# Patient Record
Sex: Female | Born: 1935 | Race: White | Hispanic: No | Smoking: Never smoker
Health system: Southern US, Community
[De-identification: ages and names within clinical notes are randomized; demographics above are authoritative.]

## PROBLEM LIST (undated history)

## (undated) HISTORY — PX: JOINT REPLACEMENT: SHX530

---

## 1997-09-14 ENCOUNTER — Emergency Department (HOSPITAL_COMMUNITY): Admission: RE | Admit: 1997-09-14 | Discharge: 1997-09-14 | Payer: Self-pay | Admitting: Emergency Medicine

## 1998-01-13 ENCOUNTER — Ambulatory Visit (HOSPITAL_COMMUNITY): Admission: RE | Admit: 1998-01-13 | Discharge: 1998-01-13 | Payer: Self-pay | Admitting: Internal Medicine

## 1998-03-25 ENCOUNTER — Other Ambulatory Visit: Admission: RE | Admit: 1998-03-25 | Discharge: 1998-03-25 | Payer: Self-pay | Admitting: Obstetrics and Gynecology

## 1998-08-17 ENCOUNTER — Other Ambulatory Visit: Admission: RE | Admit: 1998-08-17 | Discharge: 1998-08-17 | Payer: Self-pay | Admitting: Urology

## 1999-04-08 ENCOUNTER — Other Ambulatory Visit: Admission: RE | Admit: 1999-04-08 | Discharge: 1999-04-08 | Payer: Self-pay | Admitting: Obstetrics and Gynecology

## 1999-08-20 ENCOUNTER — Other Ambulatory Visit: Admission: RE | Admit: 1999-08-20 | Discharge: 1999-08-20 | Payer: Self-pay | Admitting: Urology

## 2000-05-01 ENCOUNTER — Ambulatory Visit (HOSPITAL_COMMUNITY): Admission: RE | Admit: 2000-05-01 | Discharge: 2000-05-01 | Payer: Self-pay | Admitting: Internal Medicine

## 2000-05-11 ENCOUNTER — Other Ambulatory Visit: Admission: RE | Admit: 2000-05-11 | Discharge: 2000-05-11 | Payer: Self-pay | Admitting: Obstetrics and Gynecology

## 2001-02-08 ENCOUNTER — Encounter (INDEPENDENT_AMBULATORY_CARE_PROVIDER_SITE_OTHER): Payer: Self-pay | Admitting: Specialist

## 2001-02-08 ENCOUNTER — Ambulatory Visit (HOSPITAL_BASED_OUTPATIENT_CLINIC_OR_DEPARTMENT_OTHER): Admission: RE | Admit: 2001-02-08 | Discharge: 2001-02-08 | Payer: Self-pay | Admitting: Plastic Surgery

## 2001-07-15 ENCOUNTER — Emergency Department (HOSPITAL_COMMUNITY): Admission: EM | Admit: 2001-07-15 | Discharge: 2001-07-15 | Payer: Self-pay | Admitting: Emergency Medicine

## 2001-07-15 ENCOUNTER — Encounter: Payer: Self-pay | Admitting: Emergency Medicine

## 2001-07-17 ENCOUNTER — Ambulatory Visit (HOSPITAL_COMMUNITY): Admission: RE | Admit: 2001-07-17 | Discharge: 2001-07-17 | Payer: Self-pay | Admitting: Internal Medicine

## 2001-07-24 ENCOUNTER — Ambulatory Visit (HOSPITAL_COMMUNITY): Admission: RE | Admit: 2001-07-24 | Discharge: 2001-07-24 | Payer: Self-pay | Admitting: Gastroenterology

## 2001-07-24 ENCOUNTER — Encounter (INDEPENDENT_AMBULATORY_CARE_PROVIDER_SITE_OTHER): Payer: Self-pay | Admitting: Specialist

## 2001-08-07 ENCOUNTER — Other Ambulatory Visit: Admission: RE | Admit: 2001-08-07 | Discharge: 2001-08-07 | Payer: Self-pay | Admitting: Urology

## 2002-03-18 ENCOUNTER — Other Ambulatory Visit: Admission: RE | Admit: 2002-03-18 | Discharge: 2002-03-18 | Payer: Self-pay | Admitting: Obstetrics and Gynecology

## 2002-07-13 ENCOUNTER — Inpatient Hospital Stay (HOSPITAL_COMMUNITY): Admission: EM | Admit: 2002-07-13 | Discharge: 2002-07-17 | Payer: Self-pay | Admitting: Emergency Medicine

## 2002-07-13 ENCOUNTER — Encounter: Payer: Self-pay | Admitting: Emergency Medicine

## 2002-07-17 ENCOUNTER — Inpatient Hospital Stay (HOSPITAL_COMMUNITY)
Admission: RE | Admit: 2002-07-17 | Discharge: 2002-07-22 | Payer: Self-pay | Admitting: Physical Medicine & Rehabilitation

## 2003-03-19 ENCOUNTER — Other Ambulatory Visit: Admission: RE | Admit: 2003-03-19 | Discharge: 2003-03-19 | Payer: Self-pay | Admitting: Obstetrics and Gynecology

## 2004-02-02 ENCOUNTER — Encounter: Admission: RE | Admit: 2004-02-02 | Discharge: 2004-02-02 | Payer: Self-pay | Admitting: Internal Medicine

## 2004-02-26 ENCOUNTER — Encounter: Admission: RE | Admit: 2004-02-26 | Discharge: 2004-02-26 | Payer: Self-pay | Admitting: Internal Medicine

## 2004-07-19 ENCOUNTER — Ambulatory Visit (HOSPITAL_COMMUNITY): Admission: RE | Admit: 2004-07-19 | Discharge: 2004-07-19 | Payer: Self-pay | Admitting: Obstetrics and Gynecology

## 2004-09-14 ENCOUNTER — Ambulatory Visit (HOSPITAL_COMMUNITY): Admission: RE | Admit: 2004-09-14 | Discharge: 2004-09-14 | Payer: Self-pay | Admitting: Obstetrics and Gynecology

## 2005-01-05 ENCOUNTER — Ambulatory Visit (HOSPITAL_COMMUNITY): Admission: RE | Admit: 2005-01-05 | Discharge: 2005-01-05 | Payer: Self-pay | Admitting: Obstetrics and Gynecology

## 2005-02-03 ENCOUNTER — Emergency Department (HOSPITAL_COMMUNITY): Admission: EM | Admit: 2005-02-03 | Discharge: 2005-02-03 | Payer: Self-pay | Admitting: Emergency Medicine

## 2005-05-19 ENCOUNTER — Other Ambulatory Visit: Admission: RE | Admit: 2005-05-19 | Discharge: 2005-05-19 | Payer: Self-pay | Admitting: Obstetrics and Gynecology

## 2007-06-06 ENCOUNTER — Ambulatory Visit (HOSPITAL_COMMUNITY): Admission: RE | Admit: 2007-06-06 | Discharge: 2007-06-06 | Payer: Self-pay | Admitting: Internal Medicine

## 2007-06-06 ENCOUNTER — Ambulatory Visit: Payer: Self-pay | Admitting: Vascular Surgery

## 2007-06-06 ENCOUNTER — Encounter (INDEPENDENT_AMBULATORY_CARE_PROVIDER_SITE_OTHER): Payer: Self-pay | Admitting: Internal Medicine

## 2007-07-23 ENCOUNTER — Other Ambulatory Visit: Admission: RE | Admit: 2007-07-23 | Discharge: 2007-07-23 | Payer: Self-pay | Admitting: Obstetrics and Gynecology

## 2008-09-02 ENCOUNTER — Ambulatory Visit (HOSPITAL_COMMUNITY): Admission: RE | Admit: 2008-09-02 | Discharge: 2008-09-02 | Payer: Self-pay | Admitting: Orthopaedic Surgery

## 2009-08-05 ENCOUNTER — Encounter: Admission: RE | Admit: 2009-08-05 | Discharge: 2009-08-05 | Payer: Self-pay | Admitting: Obstetrics and Gynecology

## 2011-01-07 NOTE — Discharge Summary (Signed)
NAME:  Morgan Turner, Morgan Turner                         ACCOUNT NO.:  [id number]   MEDICAL RECORD NO.:  [id number]                   PATIENT TYPE:  INP   LOCATION:  0467                                 FACILITY:  [facility]   PHYSICIAN:  Mark C. [name], M.D.                 DATE OF BIRTH:  07/15/1917   DATE OF ADMISSION:  07/13/2002  DATE OF DISCHARGE:  07/17/2002                                 DISCHARGE SUMMARY   FINAL DIAGNOSIS:  Status post left cemented monopolar hip hemiarthroplasty  for femoral neck fracture.   HISTORY OF PRESENT ILLNESS:  The patient is an 77 year old white female who  presented to the [facility] Emergency Room after falling in wet  grass at her home.  She was unable to ambulate after this accident.  X-rays  in the emergency room showed femoral neck fracture with shortening.  Denied  any lower extremity numbness or tingling.   LABORATORY DATA:  White blood cell count 12.1, red blood cell count 4.2,  hemoglobin 12.8, hematocrit 38, platelets 197.  PT 13.3, INR 1, PTT 30.  Sodium 139, potassium 4.5, chloride 107, CO2 28, glucose 128, BUN 20,  creatinine 0.8, calcium 9.2.  EKG showed normal sinus rhythm with sinus  arrhythmia.   HOSPITAL COURSE:  After evaluation in the emergency room, the patient was  taken to the operating room, and a left cemented monopolar hip  hemiarthroplasty procedure was performed.  Surgeon was Annell Greening, M.D.  Anesthesia was general.  Estimated blood loss was 100 cc.  The patient was  transferred to recovery room and then the orthopaedic floor in stable  condition.  That evening, she was started on pharmacy protocol for Coumadin  for deep venous thrombosis prophylaxis.  On 07/14/02, hemoglobin was 11, INR  1.1.  X-rays showed satisfactory position of the prosthesis.  Evaluated by  care management.  On 07/15/02, PT 17.9, INR 1.6, hemoglobin 9.9, vital signs  stable, temperature 100.7.  Staples intact.  Dressing change ordered.  The  patient was evaluated by physical therapy.  On 07/16/02, no specific  complaints.  No bowel movement.  Dulcolax suppository was ordered.  FESO4 at  325 mg p.o. daily ordered.  Bed-to-chair transfers.  Rehabilitation consult  ordered.  On 07/17/02, the patient continued to improve.  Vital signs stable  and afebrile.  PT 19.4, INR 1.8.  No complaints.  Rehabilitation bed became  available, and the patient was doing well for transfer.   DISPOSITION:  Discharged to rehabilitation.   CONDITION ON DISCHARGE:  Good and stable.   DISCHARGE MEDICATIONS:  Continue all current medications.   DISCHARGE INSTRUCTIONS:  1. The patient will continue to work with physical therapy to improve     ambulation and strengthening.  2. Staples to be removed at two weeks postoperatively.  3. She will remain on Coumadin for deep vein thrombosis prophylaxis x3 to 4  weeks postoperatively.  4. Dressing change p.r.n.    FOLLOWUP:  She will follow up in our office one week after discharge from  rehabilitation for x-rays.  If there are any problems or complications  before that time, we will be notified.      Genene Churn. Denton Meek.                      Mark C. [name], M.D.    JMO/MEDQ  D:  09/17/2002  T:  09/17/2002  Job:  [id number]

## 2011-01-07 NOTE — Op Note (Signed)
NAME:  Morgan Turner, Morgan Turner                         ACCOUNT NO.:  [id number]   MEDICAL RECORD NO.:  [id number]                   PATIENT TYPE:  INP   LOCATION:  0103                                 FACILITY:  [facility]   PHYSICIAN:  Mark C. [name], M.D.                 DATE OF BIRTH:  19-Dec-1916   DATE OF PROCEDURE:  07/13/2002  DATE OF DISCHARGE:                                 OPERATIVE REPORT   PREOPERATIVE DIAGNOSIS:  Displaced left femoral neck fracture.   POSTOPERATIVE DIAGNOSIS:  Displaced left femoral neck fracture.   OPERATION/PROCEDURE:  Cemented left ODC Osteonics monopolar  hemiarthroplasty.   ASSISTANT:  None.   ANESTHESIA:  General.   ESTIMATED BLOOD LOSS:  100 cc.   DESCRIPTION OF PROCEDURE:  After the induction of general anesthesia and  orotracheal intubation, the patient at was placed in the lateral position  with preoperative Ancef prophylaxis.  The leg was prepped with the patient  in the lateral position  with axillary roll with Duraprep, usual hip sheets,  drapes, a Purvey stocking and Coban, a sterile skin marker and Betadine Vi  drape x2 was used to seal the skin.  The posterior approach was made.  The  gluteus maximus was split in line with its fibers.  The tensor fascia was  opened.  The piriformis was tagged, cut as well as the glomeris which was a  well formed tendon.  The posterior capsule was T'd, the head was removed  with a corkscrew and was measured to 48 mm with trial 48 ball inserted into  the socket with the nice suction fit.  The oscillating saw was used to cut  the neck one fingerbreadth above the lesser trochanter.  A cookie cutter was  used with sequential reaming and then broaching to a cement #7 stem.  The #4  distal cement plug was inserted.  Pulsatile lavage with the bottle brush,  small sponge to dry the canal, vacuum mixing of the cement and placement  with a glue gun was used for pressurization of the cement.  The prosthesis  was  inserted.  Accessory cement was removed.  The C-collar +5 and 48 ball  was popped on.  The hip was reduced.  The piriformis was tagged.  The  femoral head that was removed showed no gross changes that would suggest  that this was a pathologic fracture.  The patient has a long history of  osteoporosis and has been on Actonel for an extended period of at least two  years for severe osteoporosis.  The tensor fascia was closed with  nonabsorbable #1 Ticron, 0 Vicryl in the gluteus maximus fascia, 2-0 Vicryl  in the  subcutaneous tissue, skin staple closure, Marcaine infiltration in the skin  and a postoperative dressing with a knee immobilizer.  The instrument count  and needle count was correct.  Leg lengths were equal and the patient was  transferred to the recovery room in stable condition.                                                Mark C. [name], M.D.    MCY/MEDQ  D:  07/13/2002  T:  07/13/2002  Job:  [id number]

## 2011-01-07 NOTE — Discharge Summary (Signed)
NAME:  Morgan Turner, Morgan Turner                         ACCOUNT NO.:  [id number]   MEDICAL RECORD NO.:  [id number]                   PATIENT TYPE:  IPS   LOCATION:  4155                                 FACILITY:  [facility]   PHYSICIAN:  Erick Colace, M.D.           DATE OF BIRTH:  [birth year]   DATE OF ADMISSION:  07/17/2002  DATE OF DISCHARGE:  07/22/2002                                 DISCHARGE SUMMARY   DISCHARGE DIAGNOSES:  1. Left displaced femoral neck fracture status post cemented monopolar     hemiarthroplasty 07/13/2002.  2. Anemia.  3. Osteoporosis.  4. History of  lumbar laminectomy.   HISTORY OF PRESENT ILLNESS:  This 77 year old white female with unremarkable  past medical history, who was admitted 07/13/2002 after a fall when she  slipped on the wet grass sustained and a displaced left femoral neck  fracture.  She underwent cemented monopolar hemiarthroplasty 07/13/2002 per  Dr. [name].  Placed on Coumadin for deep venous thrombosis prophylaxis and  weight bearing as tolerated. Postoperative pain management, low grade fever,  chest x-ray negative. Fever had resolved.  There was no chest pain or  shortness of breath.  There was minimal assistance for transfers and  ambulation.  Latest INR 1.8, hemoglobin 10.8, admitted for comprehensive  rehabilitation program.   PAST MEDICAL HISTORY:  Osteoporosis.   PAST SURGICAL HISTORY:  Lumbar laminectomy in 1989.   ALLERGIES:  Codeine.   SOCIAL HISTORY:  Denies alcohol or tobacco.  Lives with son in [place].  Independent prior to admission.  She is a Tourist information centre manager.  Son works  during the day. The patient works full-time as an Airline pilot.  One level  home, two to three steps to entry.   MEDICATIONS:  Prior to admission were Actonel weekly and OsCal daily.   Primary MD is Dr. Elmore Guise.   HOSPITAL COURSE:  The patient did well on rehabilitation services with  therapies initiated on a b.i.d. basis. The following issues  were followed  during the patient's rehab course.  Pertaining to Ms Witcher' left displaced  femoral neck fracture with hemiarthroplasty 07/13/2002.  Surgical site  healing nicely.  Staples remained intact.  No signs of infection.  She was  weight bearing as tolerated with hip precautions.  She would follow with Dr.  [name] for removal of staples.  Neurovascular sensation remained intact.  She  continued on Coumadin for deep venous thrombosis prophylaxis.  The day of  discharge INR of 2.2.  Home health nurse will be arranged to complete  Coumadin protocol which would be on 08/12/2002 per advanced home care.  Postoperative anemia.  Hemoglobin 11.5, hematocrit 34.1. There were no  bleeding episodes. She had no bleeding episodes.  She had no bowel or  bladder disturbances. She would resume her Actonel for osteoporosis per her  primary Dr. Elmore Guise.  She did remain on her OsCal as advised.  Overall, for  functional  mobility she was now independent in the room with her ambulation.  She required set up for activities of daily living, dressing, grooming and  home making.  She would continue with home health therapies per advanced  home care, and follow up with orthopedic services, Dr. [name].   Latest labs, INR 2.2, hemoglobin as noted above 11.5.  Sodium 139, potassium  3.5, BUN 15, creatinine 0.7.   DISCHARGE MEDICATIONS:  Included Coumadin 5 mg tablet daily until 08/12/2002  and discontinue. Trinsicon one tablet twice daily times three months, OsCal  500 mg three tablets daily. Vicodin as needed for pain.  Tylenol as needed.  The patient is advised to resume Actonel per Dr. Chilton Si.   ACTIVITY:  Weight bearing as tolerated with hip precautions.   DIET:  Regular.   Wound care followup Dr. [name] on Friday to remove staples.  Home health  nurse arranged to check INR on Wednesday 07/24/2002.     Mariam Dollar, P.A.                     Erick Colace, M.D.    DA/MEDQ  D:  07/22/2002  T:   07/22/2002  Job:  [id number]   cc:   Erick Colace, M.D.  [address] [place], [place] [address]  Fax: [phone]   Jonah Blue, M.D.  [facility] - [address]  [place], [place] [address]  Fax: [phone]   Erskine Speed, M.D.  [address] [place]  [place]  [place] [address]  Fax: [phone]

## 2011-01-07 NOTE — H&P (Signed)
   NAME:  Morgan Turner, Morgan Turner                         ACCOUNT NO.:  [id number]   MEDICAL RECORD NO.:  [id number]                   PATIENT TYPE:  INP   LOCATION:  0103                                 FACILITY:  [facility]   PHYSICIAN:  Mark C. [name], M.D.                 DATE OF BIRTH:  1916/09/12   DATE OF ADMISSION:  DATE OF DISCHARGE:                                HISTORY & PHYSICAL   ADMISSION DIAGNOSIS:  Left displaced femoral neck fracture.   HISTORY:  This 77 year old healthy female whose only significant history is  for osteoporosis treated with Actonel, fell on the wet grass suffering a  femoral neck fracture.  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  She had a lumbar laminectomy by Dr. Odis Luster in '89 which did  well and tonsillectomy over 50 years ago.   ALLERGIES:  Codeine.   MEDICATIONS:  Actonel 1 p.o. weekly, Os-Cal D 3 tablets p.o. daily.   REVIEW OF SYSTEMS:  CARDIOVASCULAR, GASTROINTESTINAL, GENITOURINARY:  Negative.   PHYSICAL EXAMINATION:  VITAL SIGNS:  Temperature 97, pulse 63, respirations  16, BP 126/59.  HEENT:  PERRLA, EOMI, TMI.  Head normocephalic, no trauma.  NECK:  Supple.  LUNGS:  Clear to auscultation.  CARDIOVASCULAR:  Regular rate and rhythm.  ABDOMEN:  Soft, nontender.  GENITOURINARY:  Foley catheter in place.  EXTREMITIES:  Left hip is shortened and externally rotated.  Pulses are  intact, sciatic nerve function is intact.   LABORATORY DATA:  CBC and chemistry panel was pending.  X-ray of the hip  demonstrates a femoral neck fracture, with shortening as expected. No  pathologic lesions are seen.   ASSESSMENT:  Osteoporosis with fall and a displaced femoral neck fracture.   PLAN:  Cemented monopolar hemiarthroplasty. Risks discussed. She understands  and requests to proceed.                                                 Mark C. [name], M.D.    MCY/MEDQ  D:  07/13/2002  T:  07/13/2002  Job:  [id number]

## 2011-01-19 ENCOUNTER — Other Ambulatory Visit (HOSPITAL_COMMUNITY): Payer: Self-pay | Admitting: Orthopedic Surgery

## 2011-01-19 ENCOUNTER — Ambulatory Visit (HOSPITAL_COMMUNITY)
Admission: RE | Admit: 2011-01-19 | Discharge: 2011-01-19 | Disposition: A | Discharge status: Home / Self Care | Payer: Medicare Other | Source: Ambulatory Visit | Attending: Orthopedic Surgery | Admitting: Orthopedic Surgery

## 2011-01-19 ENCOUNTER — Other Ambulatory Visit: Payer: Self-pay | Admitting: Orthopedic Surgery

## 2011-01-19 ENCOUNTER — Encounter (HOSPITAL_COMMUNITY): Payer: Medicare Other

## 2011-01-19 DIAGNOSIS — Z01818 Encounter for other preprocedural examination: Secondary | ICD-10-CM

## 2011-01-19 DIAGNOSIS — M25559 Pain in unspecified hip: Secondary | ICD-10-CM | POA: Insufficient documentation

## 2011-01-19 DIAGNOSIS — Z01811 Encounter for preprocedural respiratory examination: Secondary | ICD-10-CM | POA: Insufficient documentation

## 2011-01-19 DIAGNOSIS — Z01812 Encounter for preprocedural laboratory examination: Secondary | ICD-10-CM | POA: Insufficient documentation

## 2011-01-19 LAB — COMPREHENSIVE METABOLIC PANEL
AST: 23 U/L (ref 0–37)
Albumin: 4.1 g/dL (ref 3.5–5.2)
Alkaline Phosphatase: 85 U/L (ref 39–117)
GFR calc Af Amer: >60 mL/min (ref >60)
Total Bilirubin: 0.4 mg/dL (ref 0.3–1.2)
Total Protein: 6.8 g/dL (ref 6.0–8.3)

## 2011-01-19 LAB — URINALYSIS, ROUTINE W REFLEX MICROSCOPIC
Bilirubin Urine: NEGATIVE
Hgb urine dipstick: NEGATIVE
Ketones, ur: NEGATIVE mg/dL
Nitrite: NEGATIVE
Protein, ur: NEGATIVE mg/dL
Urobilinogen, UA: 0.2 mg/dL (ref 0.0–1.0)

## 2011-01-19 LAB — CBC
HCT: 38.4 % (ref 36.0–46.0)
Hemoglobin: 12.3 g/dL (ref 12.0–15.0)
MCH: 28.7 pg (ref 26.0–34.0)
MCHC: 32 g/dL (ref 30.0–36.0)
MCV: 89.7 fL (ref 78.0–100.0)

## 2011-01-19 LAB — APTT: aPTT: 32 seconds (ref 24–37)

## 2011-01-26 ENCOUNTER — Inpatient Hospital Stay (HOSPITAL_COMMUNITY)
Admission: RE | Admit: 2011-01-26 | Discharge: 2011-01-30 | DRG: 467 | Disposition: A | Discharge status: Home Health | Payer: Medicare Other | Source: Ambulatory Visit | Attending: Orthopedic Surgery | Admitting: Orthopedic Surgery

## 2011-01-26 ENCOUNTER — Inpatient Hospital Stay (HOSPITAL_COMMUNITY): Payer: Medicare Other

## 2011-01-26 DIAGNOSIS — H9190 Unspecified hearing loss, unspecified ear: Secondary | ICD-10-CM | POA: Diagnosis present

## 2011-01-26 DIAGNOSIS — T84099A Other mechanical complication of unspecified internal joint prosthesis, initial encounter: Principal | ICD-10-CM | POA: Diagnosis present

## 2011-01-26 DIAGNOSIS — M1610 Unilateral primary osteoarthritis, unspecified hip: Secondary | ICD-10-CM

## 2011-01-26 DIAGNOSIS — H409 Unspecified glaucoma: Secondary | ICD-10-CM | POA: Diagnosis present

## 2011-01-26 DIAGNOSIS — D62 Acute posthemorrhagic anemia: Secondary | ICD-10-CM | POA: Diagnosis not present

## 2011-01-26 DIAGNOSIS — Z96649 Presence of unspecified artificial hip joint: Secondary | ICD-10-CM

## 2011-01-26 DIAGNOSIS — Y831 Surgical operation with implant of artificial internal device as the cause of abnormal reaction of the patient, or of later complication, without mention of misadventure at the time of the procedure: Secondary | ICD-10-CM | POA: Diagnosis present

## 2011-01-27 LAB — BASIC METABOLIC PANEL
CO2: 27 mEq/L (ref 19–32)
Calcium: 8 mg/dL — ABNORMAL LOW (ref 8.4–10.5)
Creatinine, Ser: 0.64 mg/dL (ref 0.4–1.2)
GFR calc Af Amer: >60 mL/min (ref >60)
GFR calc non Af Amer: >60 mL/min (ref >60)

## 2011-01-27 LAB — CBC
HCT: 24 % — ABNORMAL LOW (ref 36.0–46.0)
Hemoglobin: 8.2 g/dL — ABNORMAL LOW (ref 12.0–15.0)
MCH: 30 pg (ref 26.0–34.0)
MCHC: 34.2 g/dL (ref 30.0–36.0)
MCV: 87.9 fL (ref 78.0–100.0)
WBC: 7 10*3/uL (ref 4.0–10.5)

## 2011-01-27 LAB — POCT I-STAT 4, (NA,K, GLUC, HGB,HCT)
Potassium: 4 mEq/L (ref 3.5–5.1)
Sodium: 141 mEq/L (ref 135–145)

## 2011-01-28 LAB — TYPE AND SCREEN
Unit division: 0
Unit division: 0

## 2011-01-28 LAB — CBC
MCH: 29.6 pg (ref 26.0–34.0)
MCV: 87.8 fL (ref 78.0–100.0)
Platelets: 132 10*3/uL — ABNORMAL LOW (ref 150–400)
RBC: 3.62 MIL/uL — ABNORMAL LOW (ref 3.87–5.11)

## 2011-01-28 LAB — BASIC METABOLIC PANEL
Calcium: 7.9 mg/dL — ABNORMAL LOW (ref 8.4–10.5)
Creatinine, Ser: 0.55 mg/dL (ref 0.4–1.2)
GFR calc Af Amer: >60 mL/min (ref >60)
Potassium: 3.6 mEq/L (ref 3.5–5.1)
Sodium: 134 mEq/L — ABNORMAL LOW (ref 135–145)

## 2011-01-29 LAB — CBC
HCT: 31.9 % — ABNORMAL LOW (ref 36.0–46.0)
Hemoglobin: 10.4 g/dL — ABNORMAL LOW (ref 12.0–15.0)
MCHC: 32.6 g/dL (ref 30.0–36.0)
MCV: 89.1 fL (ref 78.0–100.0)
RDW: 15.1 % (ref 11.5–15.5)

## 2011-01-29 LAB — BASIC METABOLIC PANEL
BUN: 19 mg/dL (ref 6–23)
Creatinine, Ser: 0.57 mg/dL (ref 0.4–1.2)
GFR calc Af Amer: >60 mL/min (ref >60)
GFR calc non Af Amer: >60 mL/min (ref >60)
Glucose, Bld: 117 mg/dL — ABNORMAL HIGH (ref 70–99)
Potassium: 3.7 mEq/L (ref 3.5–5.1)

## 2011-02-02 NOTE — Op Note (Signed)
Morgan Turner, Morgan Turner NO.:  [id number]  MEDICAL RECORD NO.:  [id number]  LOCATION:  1616                         FACILITY:  [facility]  PHYSICIAN:  Ollen Gross, M.D.    DATE OF BIRTH:  02-25-1917  DATE OF PROCEDURE:  01/26/2011 DATE OF DISCHARGE:                              OPERATIVE REPORT   PREOPERATIVE DIAGNOSIS:  Failed left hip bipolar hemiarthroplasty.  POSTOPERATIVE DIAGNOSIS:  Failed left hip bipolar hemiarthroplasty.  PROCEDURE:  Conversion of left hip bipolar to total hip arthroplasty.  SURGEON:  Ollen Gross, MD  ASSISTANT:  Alexzandrew L. Julien Girt, PA-C  ANESTHESIA:  General.  ESTIMATED BLOOD LOSS:  800.Marland Kitchen  DRAIN:  Hemovac x1.  COMPLICATIONS:  None.  CONDITION:  Stable to Recovery.  BRIEF CLINICAL NOTE:  Morgan Turner is a 77 year old female who had a left hip hemiarthroplasty done approximately 9 years ago by Dr. [name].  She had done well in the past few years, has had increased pain.  She definitively has wear of her acetabular cartilage and bone.  She had a bone scan which showed increased uptake in the acetabulum, but not the femoral stem.  She presents now for conversion of a bipolar to a total hip arthroplasty.  PROCEDURE IN DETAIL:  After successful administration of general anesthetic, the patient was placed in the right lateral decubitus position with the left side up and held with a hip positioner.  Left lower extremity was isolated from her perineum with plastic drapes and prepped and draped in a usual sterile fashion.  Short posterolateral incision was made with a #10 blade through subcutaneous tissue to the level of fascia lata which was incised in line with the skin incision. Sciatic nerve was palpated and protected and short rotators and pseudocapsule excised off the femur.  No evidence of any fluid in the joint.  There was a fair amount of metallosis.  I removed the metallic debris.  I dislocated the hip and removed the  unipolar femoral head. Unfortunately, there was obvious motion in the femoral stem and the stem was grossly loose.  I cleared off the soft tissue around the stem and was able to easily extract the stem.  Fortunately, the majority of cement came out with the stem.  The cement plug distally was still in and I was able to remove that with [place] cement extraction tools. Once the canal was cleaned, then I was able to retract the femur anteriorly to gain acetabular exposure.  Acetabular retractors were placed and the staple line of metallic- stained tissue was removed.  The acetabulum was very thin medially.  I started reaming the 47 coursing increments up to 2-53.  I was able to get a good concentric acetabulum.  I impacted the acetabular shell in anatomic position and transfixed with 2 dome screws.  We had excellent purchase with the shell itself and with the screws.  We then placed a 36 mm neutral +4 Marathon liner.  I then began preparing the femur.  The guide rod for the flexible reamer which was passed down the canal.  I felt all 4 cortices with the ball, the guide pin to confirm that we were intramedullary.  We  reamed starting at 10 up to 14.5 for placement of an 18 x 13 S-ROM long stem. We then reamed proximally up to an 11F and machined was sleeved to a large.  An 11F large trial sleeve was placed.  An 18 x 13 long stem with the bowed femur with a 36 +8 neck was placed.  We matched native anteversion.  The 36 +0 head was placed and the hip was reduced with excellent stability.  There was full extension, full external rotation, 70 degrees flexion, 40 degrees adduction, 90 degrees internal rotation, 90 degrees flexion, 70 degrees internal rotation.  She had a little bit of soft tissue laxity, but without stability, so we went to a +3 which corrected the laxity.  By placing the left leg on top of the right, I felt as though the leg lengths were equal.  Hip was then dislocated  and all trials were removed.  The permanent 11F large sleeve was placed and then replaced an 18 x 13 long bowed stem to the left side with a 36 +8 neck.  The stem went in about 20 degrees of anteversion which matched her native anteversion.  The 36 +3 femoral head was placed and the hip was reduced in the same stability parameters.  We took an x-ray AP and lateral which confirmed that the stem was intramedullary and did not perforate.  We then closed the posterior tissues up to the femur with #1 Ethibond through drill holes.  Fascia lata was closed over Hemovac drain with interrupted #1 Vicryl and subcutaneous closed with #1-0 and #2-0 Vicryl and subcuticular with running 4-0 Monocryl.  Drain was hooked to suction.  Incision cleaned and dried and Steri-Strips and a bulky sterile dressing applied.  She was placed into a knee immobilizer, awakened, and transported to Recovery in stable condition.     Ollen Gross, M.D.     FA/MEDQ  D:  01/26/2011  T:  01/27/2011  Job:  [id number]  Electronically Signed by Ollen Gross M.D. on 02/02/2011 03:24:44 PM

## 2011-02-14 NOTE — H&P (Signed)
NAMEREMELL, GIAIMO NO.:  [id number]  MEDICAL RECORD NO.:  [id number]  LOCATION:  1616                         FACILITY:  [facility]  PHYSICIAN:  Ollen Gross, M.D.    DATE OF BIRTH:  1916/11/02  DATE OF ADMISSION:  01/26/2011 DATE OF DISCHARGE:                             HISTORY & PHYSICAL   Ms. Sigman is a 77 year old female.  CHIEF COMPLAINT:  Left hip pain.  HISTORY OF PRESENT ILLNESS:  The patient is a 77 year old female who has been seen and evaluated second opinion by Dr. Lequita Halt for ongoing left hip pain.  She unfortunately sustained a fracture and had a hemiarthroplasty performed about 5 or 6 years ago.  She has had increasing pain over the short time requiring second opinion.  She was seen by Dr. Lequita Halt in the office where x-rays showed she has essentially eroded away most of the acetabular bone and through the acetabular cartilage down into the medial wall.  It is felt due to the progressive nature of the wearing, it is felt she would benefit from conversion over from a hemiarthroplasty of the total hip.  Risks and benefits discussed.  She would like to proceed with surgery.  ALLERGIES:  No known drug allergies.  INTOLERANCES:  CODEINE causes sickness.  CURRENT MEDICATIONS:  Multivitamin, vitamin C, vitamin D, vitamin A, vitamin B.  PAST MEDICAL HISTORY:  Impaired hearing.  Past history of migraines, glaucoma, history of cataracts post removal, childhood illnesses of measles and mumps.  PREVIOUS SURGERIES:  Left arm surgery in 1995, cataract removal, back surgery, left hip hemiarthroplasty in November 2003, and left elbow nerve surgery.  FAMILY HISTORY:  Father with heart attack.  Mother with pneumonia.  One brother with Guillain-Barre, two brothers with pneumonia, one sister with brain tumor.  SOCIAL HISTORY:  Widowed, retired Airline pilot.  Nonsmoker.  No alcohol. Two children.  Her son lives with her.  She does have four steps entering  home and also a living will.  REVIEW OF SYSTEMS:  GENERAL:  No fevers, chills, or night sweats. NEURO:  Little bit hearing loss.  No seizure or syncope.  RESPIRATORY: No  shortness breath, productive cough or hemoptysis.  CARDIOVASCULAR: No chest pain, no angina.  GI:  No nausea, vomiting, diarrhea, or constipation.  GU:  Little bit of nocturia.  No dysuria or hematuria. MUSCULOSKELETAL:  Hip pain.  PHYSICAL EXAMINATION:  VITAL SIGNS:  Pulse 88, respirations 14, blood pressure 132/60. GENERAL:  A 77 year old white female, petite, thin frame, alert, oriented, cooperative, pleasant.  Good historian. HEENT:  Normocephalic, atraumatic.  Pupils round and reactive.  EOMs intact.  Does have upper denture with a partial lower denture plate. NECK:  Supple. CHEST:  Clear. HEART:  Regular rate and rhythm.  No murmur.  S1 and S2 noted. ABDOMEN:  Soft, flat, nontender.  Bowel sounds present. RECTAL:  Not done, not pertinent to present illness. BREASTS:  Not done, not pertinent to present illness. GENITALIA:  Not done, not pertinent to present illness. EXTREMITIES:  Left hip flexion 100, internal rotation 20, external rotation 30, abduction 30.  She does ambulate with an antalgic gait.  IMPRESSION:  Left hip pain secondary to failed  left hip bipolar prosthesis.  PLAN:  The patient will be admitted to hospital, undergo revision, conversion over to a left total hip surgery will be performed by Dr. Ollen Gross.     Alexzandrew L. Julien Girt, P.A.C.   ______________________________ Ollen Gross, M.D.    ALP/MEDQ  D:  01/27/2011  T:  01/27/2011  Job:  [id number]  cc:   Erskine Speed, M.D. Fax: [phone]  Ollen Gross, M.D. Fax: [phone]  Electronically Signed by Patrica Duel P.A.C. on 02/09/2011 07:13:35 AM Electronically Signed by Ollen Gross M.D. on 02/14/2011 05:01:45 PM

## 2011-03-17 NOTE — Discharge Summary (Signed)
Morgan Turner, WEIDER NO.:  [id number]  MEDICAL RECORD NO.:  [id number]  LOCATION:  1616                         FACILITY:  [facility]  PHYSICIAN:  Ollen Gross, M.D.    DATE OF BIRTH:  08/26/1916  DATE OF ADMISSION:  01/26/2011 DATE OF DISCHARGE:  01/30/2011                              DISCHARGE SUMMARY   ADMITTING DIAGNOSES: 1. Painful left bipolar prosthesis, hip. 2. Impaired hearing. 3. History of migraines. 4. Glaucoma. 5. History of cataracts. 6. Childhood illnesses of measles, mumps.  DISCHARGE DIAGNOSES: 1. Painful left bipolar hip prosthesis status post conversion over to     a left total hip arthroplasty. 2. Postop acute blood loss anemia. 3. Status post transfusion without sequelae. 4. Postop hyponatremia. 5. Impaired hearing. 6. History of migraines. 7. Glaucoma. 8. History of cataracts. 9. Childhood illnesses of measles, mumps.  PROCEDURE:  January 26, 2011, conversion of left bipolar to left total hip. Surgeon was Dr. Lequita Halt.  Assistant, Alexzandrew L. Perkins, P.A.C. Anesthesia was general.  CONSULTS:  None.  BRIEF HISTORY:  The patient is a 77 year old female who had a left hip hemiarthroplasty done about 9 years ago per Dr. Ophelia Turner.  She has done well over the past few years, although has had increasing pain recently. She has definitely worn all the acetabular cartilage down to bone.  Bone scan showed increased uptake in the acetabulum and not femoral stem, he now presents conversion from a bipolar to a total hip.  LABORATORY DATA:  Preop CBC showed hemoglobin 12.3 on admission. Hemoglobin dropped down to 8.2; given 2 units of blood, back up to 10.7. Last noted H and H of 10.7 and 31.9.  Chem panel on admission not scanned in the chart.  Serial BMETs were followed for 3 days though sodium did drop from 138 to 134 where it stabilized.  Remaining electrolytes remained within normal limits.  Blood group type O positive.  X-RAYS:   Portable pelvis and left hip showed left total hip prosthesis to be in good position and alignment.  HOSPITAL COURSE:  The patient admitted to [facility], taken to OR, underwent above-stated procedure without complication.  The patient tolerated the procedure well, later transferred to recovery room on orthopedic floor, started on p.o. and IV analgesics.  Doing pretty well on the morning of day #1, did have a little bit of numbness in her hands following surgery which was improving by the time of morning rounds. Hemoglobin was down to 8.2.  Due to the surgery and low hemoglobin, it was felt she would benefit from undergoing blood.  She was given 2 units at that time.  By day #2, she was feeling better.  Her hemoglobin was back up to 10.7.  She wanted to go home, so arranging home health. Incision was checked.  Dressing changed.  Incision looked good.  No signs of infection.  Possibly home over the weekend.  So we started arranging discharge planning.  By day #3, she was progressing well but not met all of her goals, therefore she needed another day with therapy but she was doing better and by day #4, January 30, 2011, she was doing well, met her  goals and was discharged home.  DISCHARGE/PLAN: 1. The patient was discharged to home on January 30, 2011. 2. Discharge diagnoses, please see above. 3. Discharge meds:  Xarelto, Robaxin, tramadol.  Continue home meds of     carteolol ophthalmic drops, vitamin D, vitamin E, multivitamin.  ACTIVITY:  Partial weightbearing, 25% to 50%.  Hip precaution, total hip protocol.  FOLLOW-UP:  Two weeks.  DIET:  As tolerated.  CONDITION ON DISCHARGE:  Improved.     Alexzandrew L. Julien Girt, P.A.C.   ______________________________ Ollen Gross, M.D.    ALP/MEDQ  D:  03/10/2011  T:  03/10/2011  Job:  [id number]  cc:   Erskine Speed, M.D. Fax: [phone]  Electronically Signed by Patrica Duel P.A.C. on 03/15/2011 09:26:36  AM Electronically Signed by Ollen Gross M.D. on 03/17/2011 03:30:10 PM

## 2011-09-03 ENCOUNTER — Emergency Department (HOSPITAL_COMMUNITY)
Admission: EM | Admit: 2011-09-03 | Discharge: 2011-09-03 | Disposition: A | Discharge status: Home / Self Care | Payer: Medicare Other | Attending: Emergency Medicine | Admitting: Emergency Medicine

## 2011-09-03 ENCOUNTER — Encounter (HOSPITAL_COMMUNITY): Payer: Self-pay | Admitting: *Deleted

## 2011-09-03 ENCOUNTER — Emergency Department (HOSPITAL_COMMUNITY): Payer: Medicare Other

## 2011-09-03 DIAGNOSIS — Z96649 Presence of unspecified artificial hip joint: Secondary | ICD-10-CM | POA: Insufficient documentation

## 2011-09-03 DIAGNOSIS — S2249XA Multiple fractures of ribs, unspecified side, initial encounter for closed fracture: Secondary | ICD-10-CM

## 2011-09-03 DIAGNOSIS — R079 Chest pain, unspecified: Secondary | ICD-10-CM | POA: Insufficient documentation

## 2011-09-03 DIAGNOSIS — W07XXXA Fall from chair, initial encounter: Secondary | ICD-10-CM | POA: Insufficient documentation

## 2011-09-03 DIAGNOSIS — M549 Dorsalgia, unspecified: Secondary | ICD-10-CM | POA: Insufficient documentation

## 2011-09-03 NOTE — ED Notes (Signed)
Respiratory called for incentive spirometry teaching prior to discharge

## 2011-09-03 NOTE — ED Notes (Signed)
Patient transported to X-ray 

## 2011-09-03 NOTE — ED Provider Notes (Signed)
History     CSN: [id number]  Arrival date & time 09/03/11  2021   First MD Initiated Contact with Patient 09/03/11 2212      Chief Complaint  Patient presents with  . Back Pain  . Fall     HPI  History provided by the patient and her son. Patient is a 78 year old female with no significant past medical history who presents with complaints of left lateral chest wall pain. She states she was sitting on a chair near a table working on project all sun was in the same room behind her 2 days ago. She was leaning for an object and as she did so her chair tipped and she fell onto her left side and ribs against a rail. she complains of persistent pain and soreness to the side. Pain is worse with deep breath or coughing. Patient did take one tramadol from old prescription with some improvement of pain. Patient's son states that he did not see the fall itself are heard her. There was no LOC. Patient denies having any chest pain heart palpitations shortness of breath. Patient has no other significant past medical history.    History reviewed. No pertinent past medical history.  Past Surgical History  Procedure Date  . Joint replacement     L hip replacement    History reviewed. No pertinent family history.  History  Substance Use Topics  . Smoking status: Never Smoker   . Smokeless tobacco: Not on file  . Alcohol Use: No    OB History    Grav Para Term Preterm Abortions TAB SAB Ect Mult Living                  Review of Systems  Constitutional: Negative for fever.  Respiratory: Negative for cough and shortness of breath.   Cardiovascular: Positive for chest pain. Negative for palpitations.  Gastrointestinal: Negative for nausea, vomiting and abdominal pain.  All other systems reviewed and are negative.    Allergies  Codeine  Home Medications   Current Outpatient Rx  Name Route Sig Dispense Refill  . VITAMIN D 1000 UNITS PO TABS Oral Take 1,000 Units by mouth daily.      Marland Kitchen ONE-DAILY MULTI VITAMINS PO TABS Oral Take 1 tablet by mouth daily.    . TRAMADOL HCL 50 MG PO TABS Oral Take 50 mg by mouth every 6 (six) hours as needed. pain    . VITAMIN C 500 MG PO TABS Oral Take 500 mg by mouth daily.    Marland Kitchen VITAMIN E 400 UNITS PO CAPS Oral Take 400 Units by mouth daily.      BP 164/97  Pulse 81  Temp(Src) 98 F (36.7 C) (Oral)  Resp 18  Ht 5\' 6"  (1.676 m)  Wt 111 lb (50.349 kg)  BMI 17.92 kg/m2  SpO2 94%  Physical Exam  Nursing note and vitals reviewed. Constitutional: She is oriented to person, place, and time. She appears well-developed and well-nourished. No distress.  HENT:  Head: Normocephalic and atraumatic.  Neck: Normal range of motion. Neck supple.       No cervical midline tenderness to palpation.  Cardiovascular: Normal rate and regular rhythm.   Pulmonary/Chest: Effort normal and breath sounds normal. No respiratory distress. She has no wheezes. She has no rales. She exhibits no tenderness.       Left lateral lower chest wall tenderness to palpation. No appreciable deformity or step-off on ribs. No skin changes or bruising seen. No crepitus.  Abdominal: Soft. She exhibits no distension. There is no tenderness. There is no rebound.       No left upper quadrant pain. No splenomegaly.  Musculoskeletal: Normal range of motion. She exhibits no edema and no tenderness.       Cervical back: Normal.       Thoracic back: Normal.       Lumbar back: Normal.  Neurological: She is alert and oriented to person, place, and time. She has normal strength. No cranial nerve deficit or sensory deficit. Gait normal.  Skin: Skin is warm and dry. No rash noted.  Psychiatric: She has a normal mood and affect. Her behavior is normal.    ED Course  Procedures   Dg Ribs Unilateral W/chest Left  09/03/2011  *RADIOLOGY REPORT*  Clinical Data: Posterior left chest pain post fall  LEFT RIBS AND CHEST - 3+ VIEW  Comparison: None  Findings: Normal heart size and  pulmonary vascularity. Moderate sized hiatal hernia. Mediastinal contours otherwise normal. Emphysematous and mild bronchitic changes. Small left pleural effusion. No acute infiltrate or pneumothorax. Diffuse osseous demineralization. Thoracolumbar scoliosis. Minimal left base atelectasis. Minimally displaced fractures of three posterior lower left ribs, likely ninth, tenth and eleventh  IMPRESSION: Displaced fractures of three lower posterior left ribs, likely ninth, tenth and eleventh. Osseous demineralization. Emphysematous and bronchitic changes with small left pleural effusion and minimal left basilar atelectasis. Moderate sized hiatal hernia.  Original Report Authenticated By: Lollie Marrow, M.D.     1. Multiple rib fractures       MDM  10:30PM patient seen and evaluated. Patient in no acute distress. Patient comfortable-appearing with normal respirations.   Patient seen and discussed with attending physician. Patient with no significant past medical history who appears comfortable. Patient felt safe to return home with pain medication treatment and incentive spirometry.     Phill [facility] [place], [place] 09/04/11 [phone]

## 2011-09-03 NOTE — ED Notes (Signed)
Pt fell yesterday from sitting position when she leaned over. Pt c/o L rib pain from impact of fall.

## 2011-09-05 NOTE — ED Provider Notes (Signed)
Medical screening examination/treatment/procedure(s) were performed by non-physician practitioner and as supervising physician I was immediately available for consultation/collaboration.   Laray Anger, DO 09/05/11 1320

## 2013-05-27 IMAGING — CR DG HIP (WITH OR WITHOUT PELVIS) 2-3V*L*
3 series · 3 of 3 positions shown · non-contrast
Comparison: Report from radiographs dated 07/13/2002

CLINICAL DATA: Left hip arthroplasty.  Left hip pain.

LEFT HIP - COMPLETE 2+ VIEW

[t pelvis a.p.]
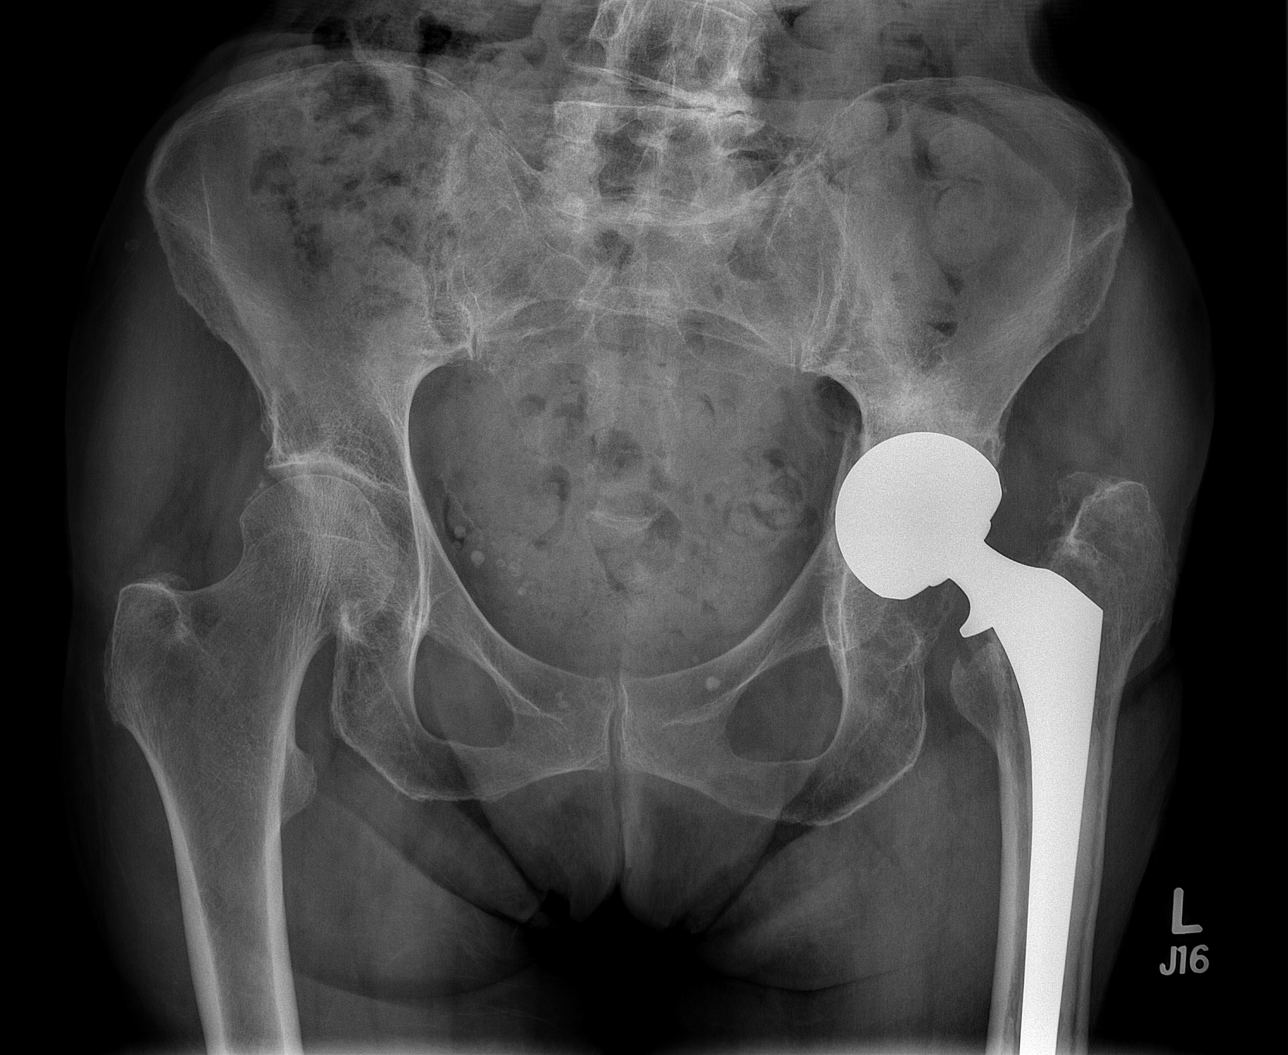

[t hip ap left]
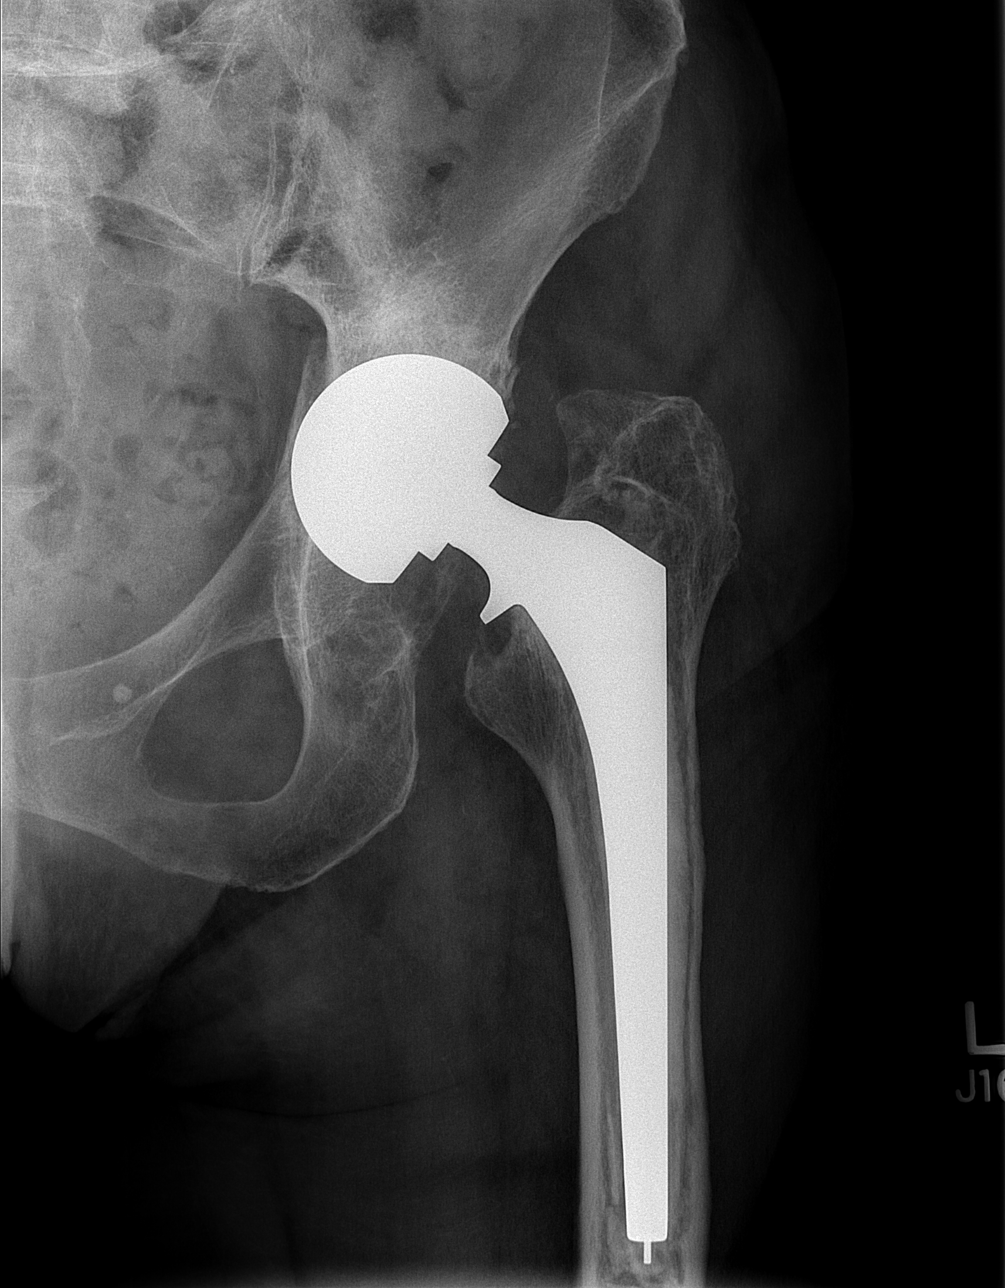

[t hip frog leg left]
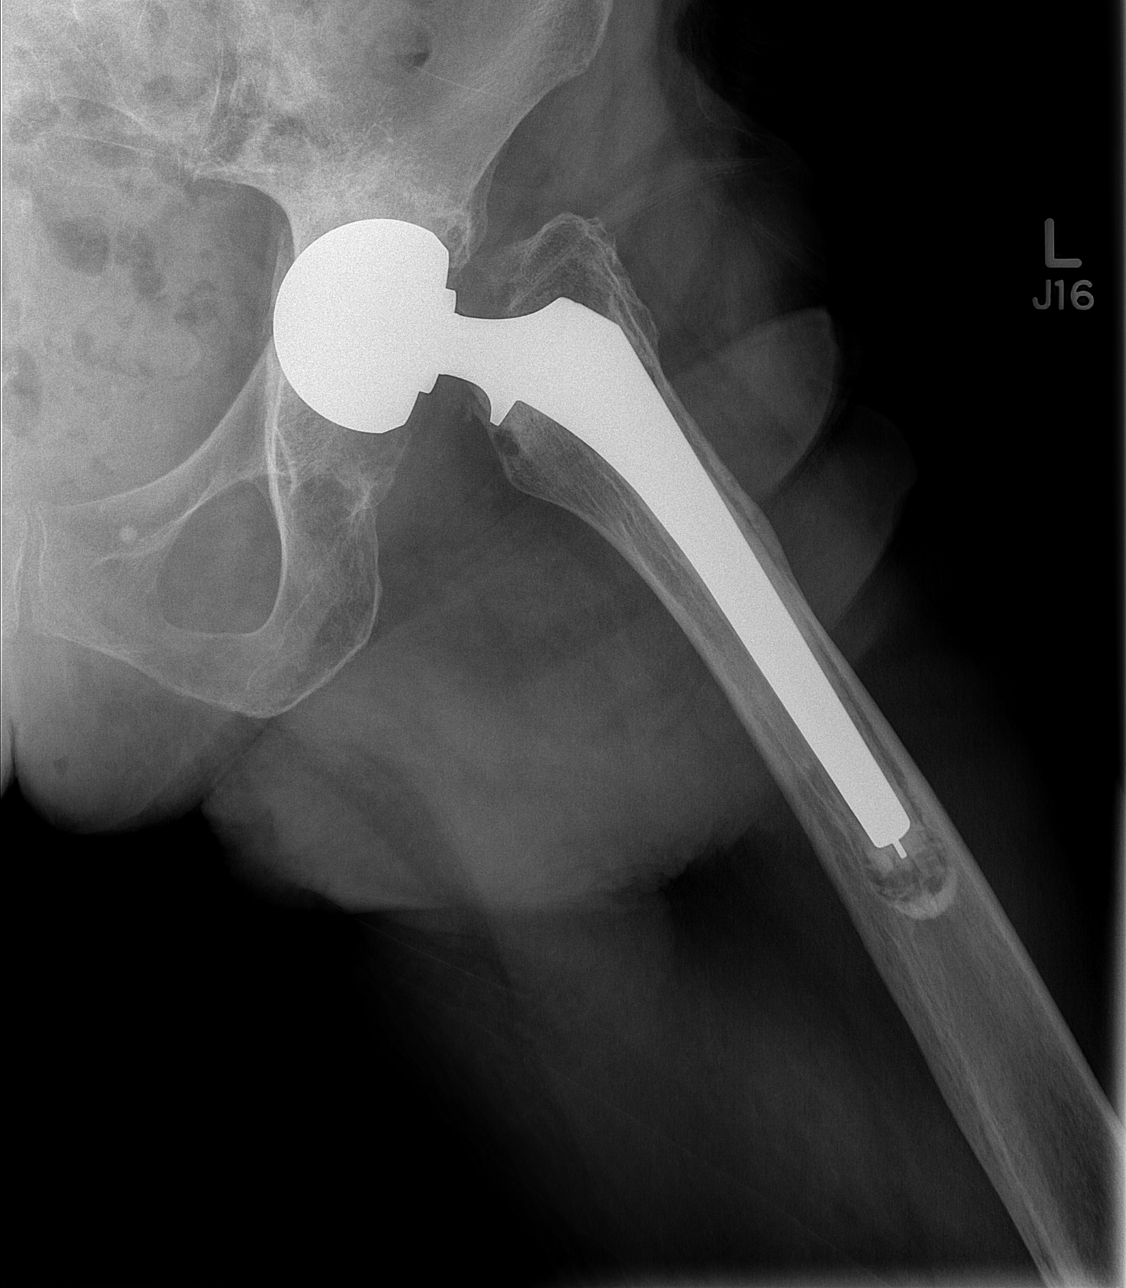

[3 of 3 positions shown; findings below may reference images not displayed]

FINDINGS: Left hip prosthesis noted with protrusio and also bony
irregularity along the quadrilateral plate suggesting a potential
fracture of the quadrilateral plate.

Prominence of stool throughout the colon suggests constipation.
Mild right hip chondrocalcinosis noted.
IMPRESSION: 1.  Left acetabular protrusio with indistinct bony irregularity of
the left quadrilateral plate suspicious for fracture.  Fracture
margins are indistinct and this could represent a chronic injury.
2. Prominence of stool throughout the colon suggests constipation.

## 2013-06-03 IMAGING — CR DG PORTABLE PELVIS
1 series · 1 of 1 positions shown · non-contrast
Comparison: None.

CLINICAL DATA: Postop left hip replacement.

PORTABLE PELVIS

[AP]
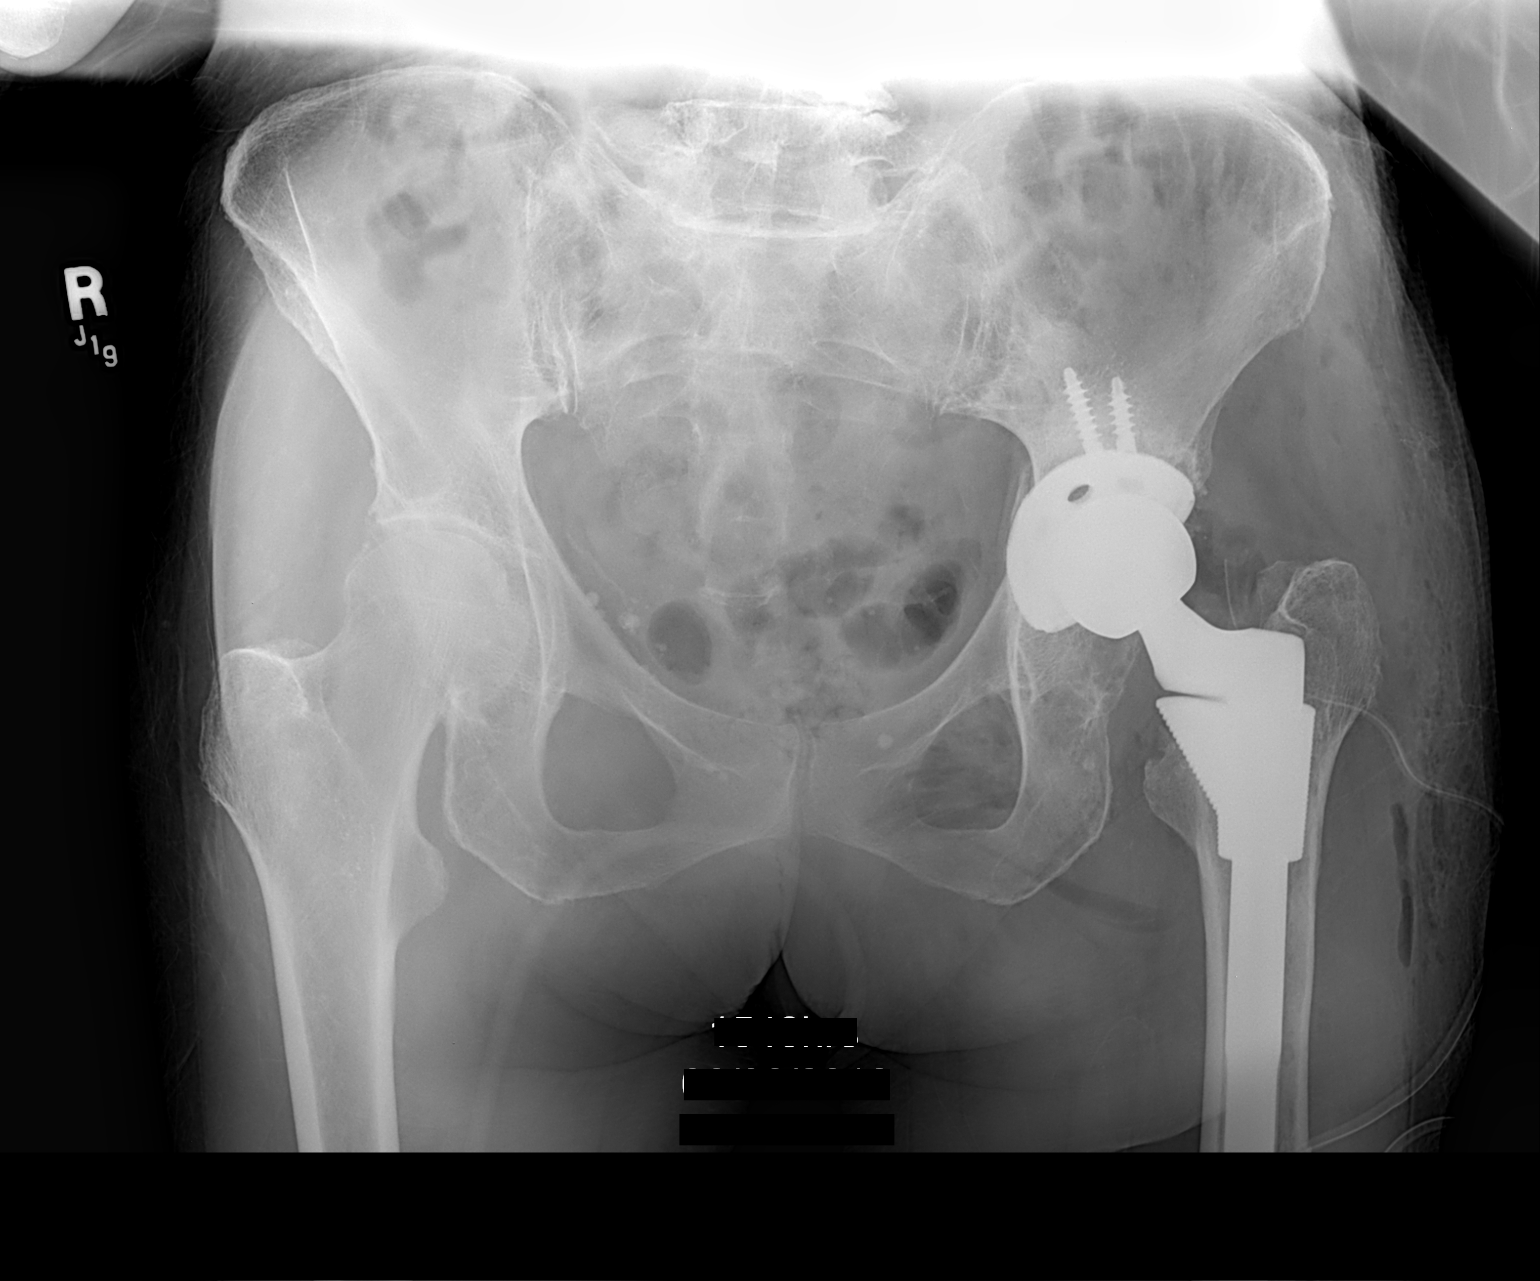

[1 of 1 positions shown; findings below may reference images not displayed]

FINDINGS: 01/19/2011.  Frontal pelvis shows diffuse osteopenia.
The patient has had interval left hip replacement.  There is an
abrupt cortical step off along the lateral cortex of the proximal
femoral diaphysis, concerning for fracture.  Gas in the soft
tissues is compatible with the immediate postoperative state.
Surgical drain is noted.
IMPRESSION: Status post left total hip revision.  Question nondisplaced
proximal femur fracture.

## 2014-04-06 ENCOUNTER — Encounter (HOSPITAL_COMMUNITY): Payer: Self-pay | Admitting: Emergency Medicine

## 2014-04-06 ENCOUNTER — Emergency Department (HOSPITAL_COMMUNITY)
Admission: EM | Admit: 2014-04-06 | Discharge: 2014-04-06 | Disposition: A | Discharge status: Home / Self Care | Payer: Medicare Other | Attending: Emergency Medicine | Admitting: Emergency Medicine

## 2014-04-06 DIAGNOSIS — L02419 Cutaneous abscess of limb, unspecified: Secondary | ICD-10-CM | POA: Insufficient documentation

## 2014-04-06 DIAGNOSIS — L03119 Cellulitis of unspecified part of limb: Principal | ICD-10-CM

## 2014-04-06 DIAGNOSIS — L03116 Cellulitis of left lower limb: Secondary | ICD-10-CM

## 2014-04-06 DIAGNOSIS — M7989 Other specified soft tissue disorders: Secondary | ICD-10-CM | POA: Insufficient documentation

## 2014-04-06 DIAGNOSIS — Z79899 Other long term (current) drug therapy: Secondary | ICD-10-CM | POA: Insufficient documentation

## 2014-04-06 LAB — BASIC METABOLIC PANEL
ANION GAP: 11 (ref 5–15)
BUN: 16 mg/dL (ref 6–23)
CHLORIDE: 103 meq/L (ref 96–112)
CO2: 25 meq/L (ref 19–32)
CREATININE: 0.67 mg/dL (ref 0.50–1.10)
Calcium: 9.4 mg/dL (ref 8.4–10.5)
GFR calc Af Amer: 84 mL/min — ABNORMAL LOW (ref >90)
GFR calc non Af Amer: 72 mL/min — ABNORMAL LOW (ref >90)
Glucose, Bld: 107 mg/dL — ABNORMAL HIGH (ref 70–99)
POTASSIUM: 4.3 meq/L (ref 3.7–5.3)
Sodium: 139 mEq/L (ref 137–147)

## 2014-04-06 LAB — CBC
HCT: 37.4 % (ref 36.0–46.0)
Hemoglobin: 12.1 g/dL (ref 12.0–15.0)
MCH: 29.1 pg (ref 26.0–34.0)
MCHC: 32.4 g/dL (ref 30.0–36.0)
MCV: 89.9 fL (ref 78.0–100.0)
PLATELETS: 229 10*3/uL (ref 150–400)
RBC: 4.16 MIL/uL (ref 3.87–5.11)
RDW: 14.1 % (ref 11.5–15.5)
WBC: 7.9 10*3/uL (ref 4.0–10.5)

## 2014-04-06 LAB — PROTIME-INR
INR: 1.04 (ref 0.00–1.49)
PROTHROMBIN TIME: 13.6 s (ref 11.6–15.2)

## 2014-04-06 MED ORDER — CEPHALEXIN 500 MG PO CAPS
500.0000 mg | ORAL_CAPSULE | Freq: Three times a day (TID) | ORAL | Status: DC
  Administered 2014-04-06: 500 mg via ORAL
  Filled 2014-04-06: qty 1

## 2014-04-06 MED ORDER — CEPHALEXIN 500 MG PO CAPS: 500.0000 mg | ORAL_CAPSULE | Freq: Three times a day (TID) | ORAL | Status: DC

## 2014-04-06 NOTE — Discharge Instructions (Signed)

## 2014-04-06 NOTE — ED Provider Notes (Signed)
CSN: [id number]     Arrival date & time 04/06/14  1442 History   First MD Initiated Contact with Patient 04/06/14 1514     Chief Complaint  Patient presents with  . Leg Swelling    The history is provided by the patient.  Pt noticed swelling in her leg about one week ago.  It is not painful but does feel tight.  No change in the last week.  No fevers.  No chills.   No recent travel.  No history of DVT.  Pt was not able to get into to see a doctor.  She came today because her son came in town to help her.  History reviewed. No pertinent past medical history. Past Surgical History  Procedure Laterality Date  . Joint replacement      L hip replacement   No family history on file. History  Substance Use Topics  . Smoking status: Never Smoker   . Smokeless tobacco: Not on file  . Alcohol Use: No   OB History   Grav Para Term Preterm Abortions TAB SAB Ect Mult Living                 Review of Systems  All other systems reviewed and are negative.     Allergies  Codeine  Home Medications   Prior to Admission medications   Medication Sig Start Date End Date Taking? Authorizing Provider  Multiple Vitamin (MULTIVITAMIN) tablet Take 1 tablet by mouth daily.   Yes Historical Provider, MD  cephALEXin (KEFLEX) 500 MG capsule Take 1 capsule (500 mg total) by mouth every 8 (eight) hours. 04/06/14   Dorie Rank, MD   BP 146/76  Pulse 92  Temp(Src) 98.7 F (37.1 C) (Oral)  Resp 17  SpO2 100% Physical Exam  Nursing note and vitals reviewed. Constitutional: She appears well-developed and well-nourished. No distress.  HENT:  Head: Normocephalic and atraumatic.  Right Ear: External ear normal.  Left Ear: External ear normal.  Eyes: Conjunctivae are normal. Right eye exhibits no discharge. Left eye exhibits no discharge. No scleral icterus.  Neck: Neck supple. No tracheal deviation present.  Cardiovascular: Normal rate, regular rhythm and intact distal pulses.   Pulmonary/Chest:  Effort normal and breath sounds normal. No stridor. No respiratory distress. She has no wheezes. She has no rales.  Abdominal: Soft. Bowel sounds are normal. She exhibits no distension. There is no tenderness. There is no rebound and no guarding.  Musculoskeletal: She exhibits edema and tenderness.       Left upper leg: Normal.       Right lower leg: Normal.       Left lower leg: She exhibits tenderness, swelling and edema. She exhibits no deformity and no laceration.  Neurological: She is alert. She has normal strength. No cranial nerve deficit (no facial droop, extraocular movements intact, no slurred speech) or sensory deficit. She exhibits normal muscle tone. She displays no seizure activity. Coordination normal.  Skin: Skin is warm and dry. No rash noted.  Psychiatric: She has a normal mood and affect.    ED Course  Procedures (including critical care time) Labs Review Labs Reviewed  BASIC METABOLIC PANEL - Abnormal; Notable for the following:    Glucose, Bld 107 (*)    GFR calc non Af Amer 72 (*)    GFR calc Af Amer 84 (*)    All other components within normal limits  CBC  PROTIME-INR    Doppler study without dvt  MDM  Final diagnoses:  Cellulitis of left lower extremity   Consistent with cellulitis.  No dvt noted on vasc ultrasound.  Non toxic.  No fever.  Will dc home with oral abx, follow up with pcp   Dorie Rank, MD 04/09/14 1535

## 2014-04-06 NOTE — Progress Notes (Signed)
Left lower extremity venous duplex completed.  Left:  No evidence of DVT, superficial thrombosis, or Baker's cyst.  Right:  Negative for DVT in the common femoral vein.  

## 2014-04-06 NOTE — ED Notes (Signed)
Pt presents with left leg swelling that began one week ago. Pt denies a history of blood clots, pt reports no medical history, and currently taking no home medications. Pt denies any long distance travel or any change in her activity from baseline. Pt is A/O x4, in NAD, and vitals are WDL.

## 2014-05-08 ENCOUNTER — Encounter (HOSPITAL_BASED_OUTPATIENT_CLINIC_OR_DEPARTMENT_OTHER): Payer: Medicare Other | Attending: Internal Medicine

## 2014-05-08 DIAGNOSIS — L03119 Cellulitis of unspecified part of limb: Principal | ICD-10-CM

## 2014-05-08 DIAGNOSIS — L02419 Cutaneous abscess of limb, unspecified: Secondary | ICD-10-CM | POA: Diagnosis not present

## 2014-05-08 NOTE — Progress Notes (Signed)
[facility]  NAME:  Morgan Turner, Morgan Turner NO.:  [id number]  MEDICAL RECORD NO.:  [id number]      DATE OF BIRTH:  April 06, 1917  PHYSICIAN:  Judene Companion, M.D.      VISIT DATE:  05/08/2014                                  OFFICE VISIT   Morgan Turner is amazing 80 year old lady who is extremely alert, very active, walks every day, takes no medicine, has no history of any illnesses, whatsoever.  She comes here with a blood pressure 112/70, pulse 68, temperature 98.6.  She is a well-developed, well-nourished and only weighs 120 pounds.  She is 5 feet 4 inches.  She comes to [place] because of a swollen erythematous left leg.  I do not see any ulcers. There is no history of any trauma.  She moves the leg well.  She has excellent pedal pulses.  I am diagnosing her as a cellulitis, etiology I am not sure of.  She probably has a little venous stasis, so I am going to put her on antibiotics, Augmentin, and put her in some mild compression and will have her come back here in a week.  DIAGNOSIS:  Cellulitis, left leg.     Judene Companion, M.D.     PP/MEDQ  D:  05/08/2014  T:  05/08/2014  Job:  [id number]

## 2014-05-14 DIAGNOSIS — L02419 Cutaneous abscess of limb, unspecified: Secondary | ICD-10-CM | POA: Diagnosis not present

## 2014-05-14 DIAGNOSIS — L03119 Cellulitis of unspecified part of limb: Secondary | ICD-10-CM | POA: Diagnosis not present

## 2016-01-27 ENCOUNTER — Ambulatory Visit: Payer: Medicare Other | Admitting: Podiatry

## 2016-02-24 ENCOUNTER — Ambulatory Visit (INDEPENDENT_AMBULATORY_CARE_PROVIDER_SITE_OTHER): Payer: Medicare Other | Admitting: Podiatry

## 2016-02-24 ENCOUNTER — Encounter: Payer: Self-pay | Admitting: Podiatry

## 2016-02-24 VITALS — BP 105/64 | HR 86 | Resp 14

## 2016-02-24 DIAGNOSIS — B351 Tinea unguium: Secondary | ICD-10-CM

## 2016-02-24 DIAGNOSIS — M79676 Pain in unspecified toe(s): Secondary | ICD-10-CM

## 2016-02-24 NOTE — Progress Notes (Signed)
   Subjective:    Patient ID: Morgan Turner, female    DOB: [birth year], 82 y.o.   MRN: [id number]  HPI this patient presents to the office with chief complaint of long thick painful nails. This patient states that the nails are painful walking and wearing her shoes. She was referred to this office by Dr. Gershon Mussel. She also has a painful callus under the ball of her right foot. She presents the office today for preventative foot care services    Review of Systems  All other systems reviewed and are negative.      Objective:   Physical Exam GENERAL APPEARANCE: Alert, conversant. Appropriately groomed. No acute distress.  VASCULAR: Pedal pulses are  palpable at  [facility] and PT bilateral.  Capillary refill time is immediate to all digits,  Normal temperature gradient.  Digital hair growth is present bilateral  NEUROLOGIC: sensation is normal to 5.07 monofilament at 5/5 sites bilateral.  Light touch is intact bilateral, Muscle strength normal.  MUSCULOSKELETAL: acceptable muscle strength, tone and stability bilateral.  Intrinsic muscluature intact bilateral.  Rectus appearance of foot and digits noted bilateral.   DERMATOLOGIC: skin color, texture, and turgor are within normal limits.  No preulcerative lesions or ulcers  are seen, no interdigital maceration noted.  No open lesions present.   No drainage noted. Callus right forefoot asymptomatic.  NAIL  Thick disfigured discolored nails both feet.         Assessment & Plan:  Onychomycosis B/l  IE  Debridement of nails B/L.  RTC 3 months.   Gardiner Barefoot DPM

## 2016-04-19 ENCOUNTER — Other Ambulatory Visit: Payer: Self-pay | Admitting: Certified Registered Nurse Anesthetist

## 2016-04-19 ENCOUNTER — Encounter (HOSPITAL_BASED_OUTPATIENT_CLINIC_OR_DEPARTMENT_OTHER): Payer: Medicare Other | Attending: Surgery

## 2016-04-19 DIAGNOSIS — Z96642 Presence of left artificial hip joint: Secondary | ICD-10-CM | POA: Diagnosis not present

## 2016-04-19 DIAGNOSIS — M1990 Unspecified osteoarthritis, unspecified site: Secondary | ICD-10-CM | POA: Diagnosis not present

## 2016-04-19 DIAGNOSIS — L97821 Non-pressure chronic ulcer of other part of left lower leg limited to breakdown of skin: Secondary | ICD-10-CM | POA: Diagnosis present

## 2016-04-27 ENCOUNTER — Encounter (HOSPITAL_BASED_OUTPATIENT_CLINIC_OR_DEPARTMENT_OTHER): Payer: Medicare Other | Attending: Surgery

## 2016-04-27 DIAGNOSIS — Z96642 Presence of left artificial hip joint: Secondary | ICD-10-CM | POA: Insufficient documentation

## 2016-04-27 DIAGNOSIS — C44719 Basal cell carcinoma of skin of left lower limb, including hip: Secondary | ICD-10-CM | POA: Insufficient documentation

## 2016-04-27 DIAGNOSIS — L97329 Non-pressure chronic ulcer of left ankle with unspecified severity: Secondary | ICD-10-CM | POA: Diagnosis present

## 2016-05-19 ENCOUNTER — Encounter: Payer: Self-pay | Admitting: Podiatry

## 2016-05-19 ENCOUNTER — Ambulatory Visit (INDEPENDENT_AMBULATORY_CARE_PROVIDER_SITE_OTHER): Payer: Medicare Other | Admitting: Podiatry

## 2016-05-19 VITALS — BP 129/79 | HR 86 | Resp 14

## 2016-05-19 DIAGNOSIS — M79676 Pain in unspecified toe(s): Secondary | ICD-10-CM | POA: Diagnosis not present

## 2016-05-19 DIAGNOSIS — B351 Tinea unguium: Secondary | ICD-10-CM | POA: Diagnosis not present

## 2016-05-19 NOTE — Progress Notes (Signed)
   Subjective:    Patient ID: Morgan Turner, female    DOB: 08/21/1917, 82 y.o.   MRN: [id number]  HPI this patient presents to the office with chief complaint of long thick painful nails. This patient states that the nails are painful walking and wearing her shoes.   She presents the office today for preventative foot care services    Review of Systems  All other systems reviewed and are negative.      Objective:   Physical Exam GENERAL APPEARANCE: Alert, conversant. Appropriately groomed. No acute distress.  VASCULAR: Pedal pulses are  palpable at  DP and PT bilateral.  Capillary refill time is immediate to all digits,  Normal temperature gradient.  Digital hair growth is present bilateral  NEUROLOGIC: sensation is normal to 5.07 monofilament at 5/5 sites bilateral.  Light touch is intact bilateral, Muscle strength normal.  MUSCULOSKELETAL: acceptable muscle strength, tone and stability bilateral.  Intrinsic muscluature intact bilateral.  Rectus appearance of foot and digits noted bilateral.   DERMATOLOGIC: skin color, texture, and turgor are within normal limits.  No preulcerative lesions or ulcers  are seen, no interdigital maceration noted.  No open lesions present.   No drainage noted. Callus right forefoot asymptomatic.  NAIL  Thick disfigured discolored nails both feet.         Assessment & Plan:  Onychomycosis B/l  IE  Debridement of nails B/L.  RTC 10 weeks   Makella Buckingham DPM 

## 2016-07-28 ENCOUNTER — Ambulatory Visit: Payer: Medicare Other | Admitting: Podiatry

## 2016-07-28 ENCOUNTER — Encounter: Payer: Self-pay | Admitting: Podiatry

## 2016-07-28 ENCOUNTER — Ambulatory Visit (INDEPENDENT_AMBULATORY_CARE_PROVIDER_SITE_OTHER): Payer: Medicare Other | Admitting: Podiatry

## 2016-07-28 DIAGNOSIS — B351 Tinea unguium: Secondary | ICD-10-CM

## 2016-07-28 DIAGNOSIS — M79676 Pain in unspecified toe(s): Secondary | ICD-10-CM

## 2016-07-28 NOTE — Progress Notes (Signed)
   Subjective:    Patient ID: Morgan Turner, female    DOB: 11/28/1916, 82 y.o.   MRN: [id number]  HPI this patient presents to the office with chief complaint of long thick painful nails. This patient states that the nails are painful walking and wearing her shoes.   She presents the office today for preventative foot care services    Review of Systems  All other systems reviewed and are negative.      Objective:   Physical Exam GENERAL APPEARANCE: Alert, conversant. Appropriately groomed. No acute distress.  VASCULAR: Pedal pulses are  palpable at  DP and PT bilateral.  Capillary refill time is immediate to all digits,  Normal temperature gradient.  Digital hair growth is present bilateral  NEUROLOGIC: sensation is normal to 5.07 monofilament at 5/5 sites bilateral.  Light touch is intact bilateral, Muscle strength normal.  MUSCULOSKELETAL: acceptable muscle strength, tone and stability bilateral.  Intrinsic muscluature intact bilateral.  Rectus appearance of foot and digits noted bilateral.   DERMATOLOGIC: skin color, texture, and turgor are within normal limits.  No preulcerative lesions or ulcers  are seen, no interdigital maceration noted.  No open lesions present.   No drainage noted. Callus right forefoot asymptomatic.  NAIL  Thick disfigured discolored nails both feet.         Assessment & Plan:  Onychomycosis B/l  IE  Debridement of nails B/L.  RTC 10 weeks   Hadlyn Amero DPM 

## 2016-08-18 ENCOUNTER — Ambulatory Visit: Payer: Medicare Other | Admitting: Podiatry

## 2016-10-19 ENCOUNTER — Telehealth: Payer: Self-pay | Admitting: *Deleted

## 2016-10-19 NOTE — Telephone Encounter (Signed)
Morgan Turner states pt has an appt tomorrow and he was not aware 10/20/2016 was tomorrow and needs to reschedule.

## 2016-10-20 ENCOUNTER — Ambulatory Visit: Payer: Medicare Other | Admitting: Podiatry

## 2016-10-27 ENCOUNTER — Encounter: Payer: Self-pay | Admitting: Podiatry

## 2016-10-27 ENCOUNTER — Ambulatory Visit (INDEPENDENT_AMBULATORY_CARE_PROVIDER_SITE_OTHER): Payer: Medicare Other | Admitting: Podiatry

## 2016-10-27 DIAGNOSIS — B351 Tinea unguium: Secondary | ICD-10-CM

## 2016-10-27 DIAGNOSIS — M79676 Pain in unspecified toe(s): Secondary | ICD-10-CM

## 2016-10-27 NOTE — Progress Notes (Signed)
   Subjective:    Patient ID: Morgan Turner, female    DOB: 04-Oct-1916, 83 y.o.   MRN: [id number]  HPI this patient presents to the office with chief complaint of long thick painful nails. This patient states that the nails are painful walking and wearing her shoes.   She presents the office today for preventative foot care services    Review of Systems  All other systems reviewed and are negative.      Objective:   Physical Exam GENERAL APPEARANCE: Alert, conversant. Appropriately groomed. No acute distress.  VASCULAR: Pedal pulses are  palpable at  [facility] and PT bilateral.  Capillary refill time is immediate to all digits,  Normal temperature gradient.  Digital hair growth is present bilateral  NEUROLOGIC: sensation is normal to 5.07 monofilament at 5/5 sites bilateral.  Light touch is intact bilateral, Muscle strength normal.  MUSCULOSKELETAL: acceptable muscle strength, tone and stability bilateral.  Intrinsic muscluature intact bilateral.  Rectus appearance of foot and digits noted bilateral.   DERMATOLOGIC: skin color, texture, and turgor are within normal limits.  No preulcerative lesions or ulcers  are seen, no interdigital maceration noted.  No open lesions present.   No drainage noted. Callus right forefoot asymptomatic.  NAIL  Thick disfigured discolored nails both feet.         Assessment & Plan:  Onychomycosis B/l  IE  Debridement of nails B/L.  RTC 10 weeks   Gardiner Barefoot DPM

## 2017-01-04 ENCOUNTER — Ambulatory Visit: Payer: Medicare Other | Admitting: Podiatry

## 2018-07-09 ENCOUNTER — Encounter: Payer: Self-pay | Admitting: Podiatry

## 2018-07-09 ENCOUNTER — Ambulatory Visit (INDEPENDENT_AMBULATORY_CARE_PROVIDER_SITE_OTHER): Payer: Medicare Other | Admitting: Podiatry

## 2018-07-09 DIAGNOSIS — B351 Tinea unguium: Secondary | ICD-10-CM | POA: Diagnosis not present

## 2018-07-09 DIAGNOSIS — M79676 Pain in unspecified toe(s): Secondary | ICD-10-CM | POA: Diagnosis not present

## 2018-07-10 ENCOUNTER — Encounter: Payer: Self-pay | Admitting: Podiatry

## 2018-07-10 NOTE — Progress Notes (Signed)
Subjective: Morgan Turner presents today with  cc of painful, discolored, thick toenails which interfere with daily activities and routine tasks.  Pain is aggravated when wearing enclosed shoe gear. Pain is getting progressively worse and relieved with periodic professional debridement.  Objective: Vascular Examination: Capillary refill time immediate x 10 digits Dorsalis pedis and posterior tibial pulses present b/l Digital hair x 10 digits is present Skin temperature gradient WNL b/l  Dermatological Examination: Pedal skin with normal turgor texture and tone b/l Toenails 1-5 b/l discolored, thick, dystrophic with subungual debris and pain with palpation to nailbeds due to thickness of nails.  Musculoskeletal: Muscle strength 5/5 to all LE muscle groups No gross pedal abnormalities  Neurological: Sensation intact with 10 gram monofilament. Vibratory sensation intact.  Assessment: Painful onychomycosis toenails 1-5 b/l   Plan: 1. Toenails 1-5 b/l were debrided in length and girth without iatrogenic bleeding. 2. Patient to continue soft, supportive shoe gear 3. Patient to report any pedal injuries to medical professional immediately. 4. Follow up 3 months. Patient/POA to call should there be a concern in the interim.

## 2018-10-08 ENCOUNTER — Ambulatory Visit: Payer: Medicare Other | Admitting: Podiatry

## 2020-06-22 DEATH — deceased
